# Patient Record
Sex: Male | Born: 1985 | Hispanic: Yes | Marital: Married | Smoking: Never smoker
Health system: Southern US, Community
[De-identification: ages and names within clinical notes are randomized; demographics above are authoritative.]

---

## 2015-10-10 ENCOUNTER — Other Ambulatory Visit: Payer: Self-pay | Admitting: Family Medicine

## 2015-10-10 DIAGNOSIS — N50819 Testicular pain, unspecified: Secondary | ICD-10-CM

## 2015-10-10 DIAGNOSIS — R109 Unspecified abdominal pain: Secondary | ICD-10-CM

## 2015-10-14 ENCOUNTER — Ambulatory Visit
Admission: RE | Admit: 2015-10-14 | Discharge: 2015-10-14 | Disposition: A | Discharge status: Home / Self Care | Payer: BLUE CROSS/BLUE SHIELD | Source: Ambulatory Visit | Attending: Family Medicine | Admitting: Family Medicine

## 2015-10-14 ENCOUNTER — Other Ambulatory Visit: Payer: Self-pay | Admitting: Family Medicine

## 2015-10-14 DIAGNOSIS — R109 Unspecified abdominal pain: Secondary | ICD-10-CM

## 2015-10-14 DIAGNOSIS — N50819 Testicular pain, unspecified: Secondary | ICD-10-CM

## 2017-07-19 IMAGING — US US PELVIS LIMITED
1 series · 8 of 8 positions shown · non-contrast
Comparison: Scrotal ultrasound same day

CLINICAL DATA: Possible left inguinal hernia

EXAM:
LIMITED ULTRASOUND OF left inguinal region
TECHNIQUE: Limited transabdominal ultrasound examination of the left inguinal
region

[Series 1: us pelvis limited · 0.12mm/px · 8 acquisitions, 8 frames shown]
[im 1/8]
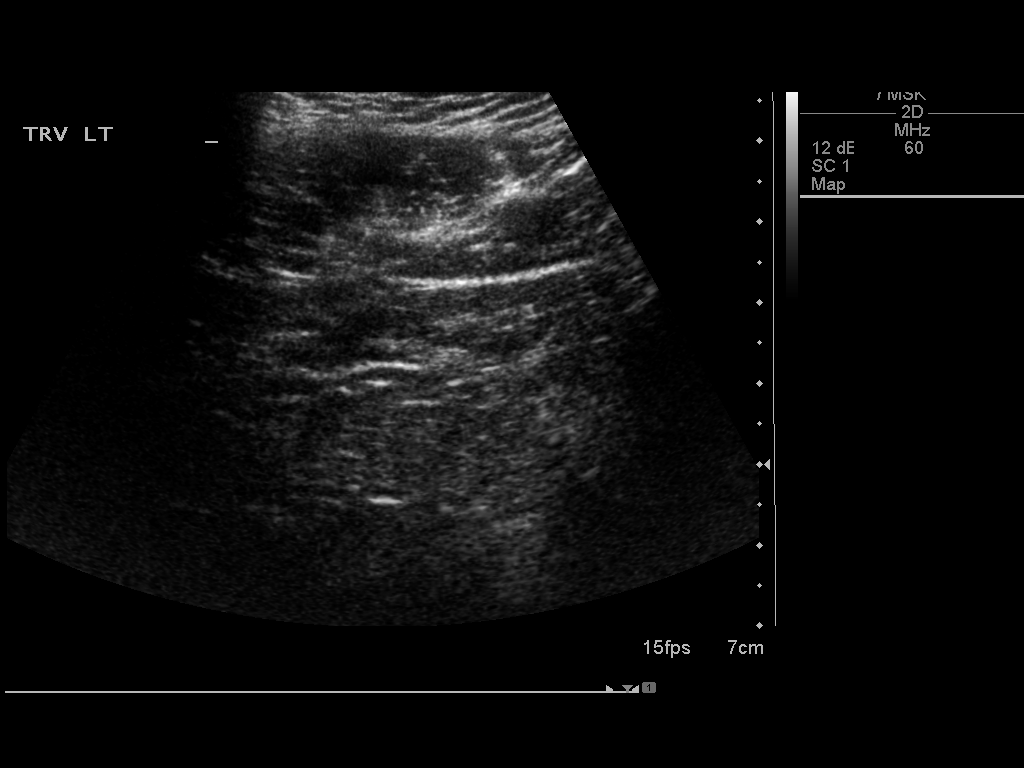
[im 2/8]
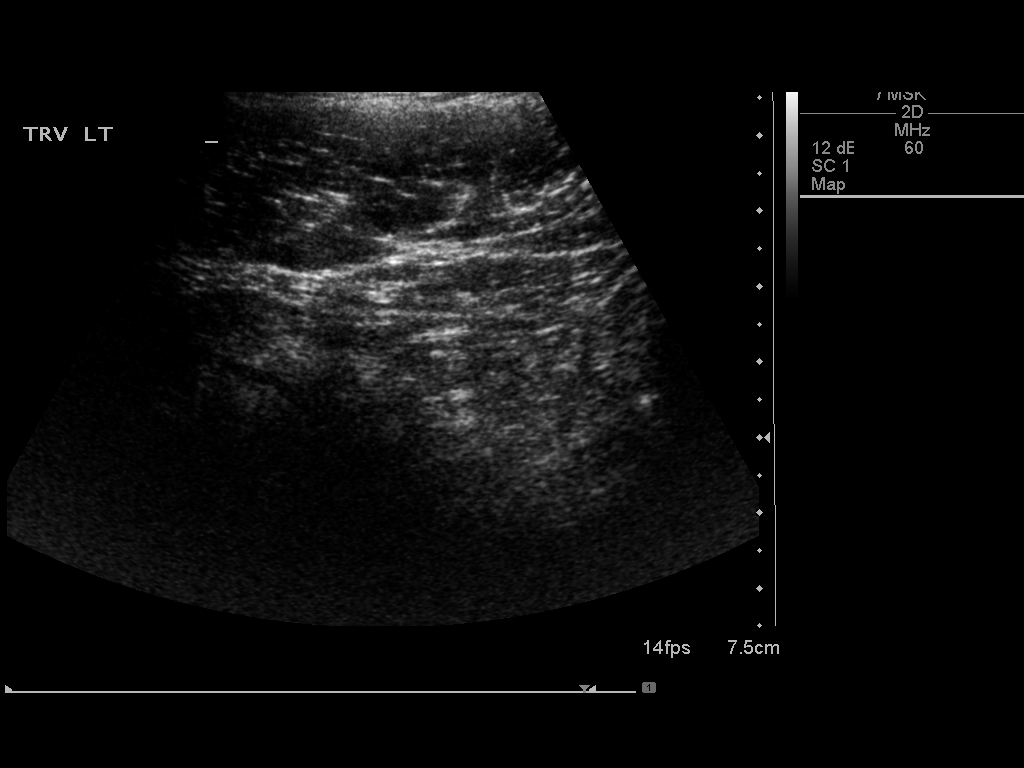
[im 3/8]
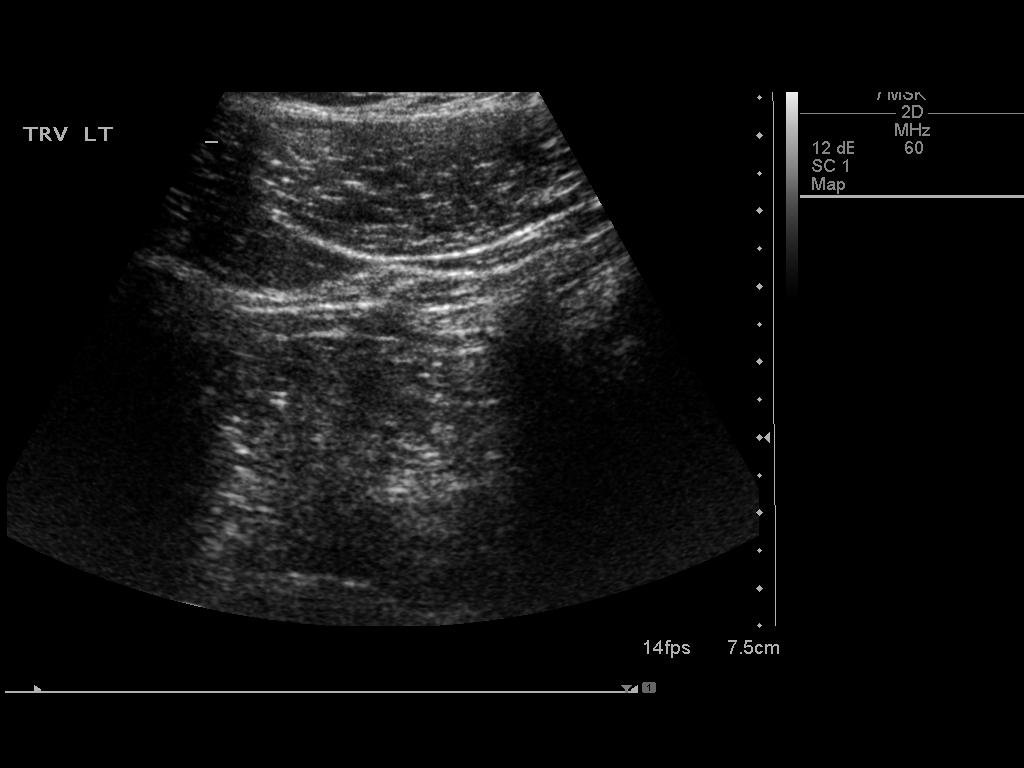
[im 4/8]
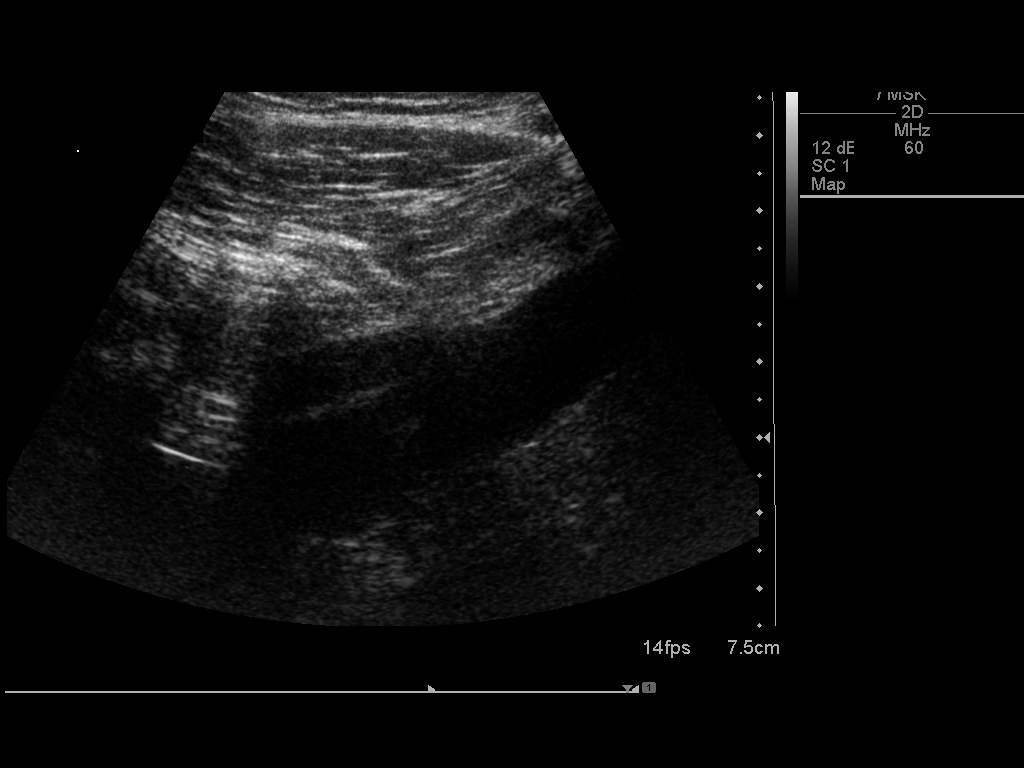
[im 5/8]
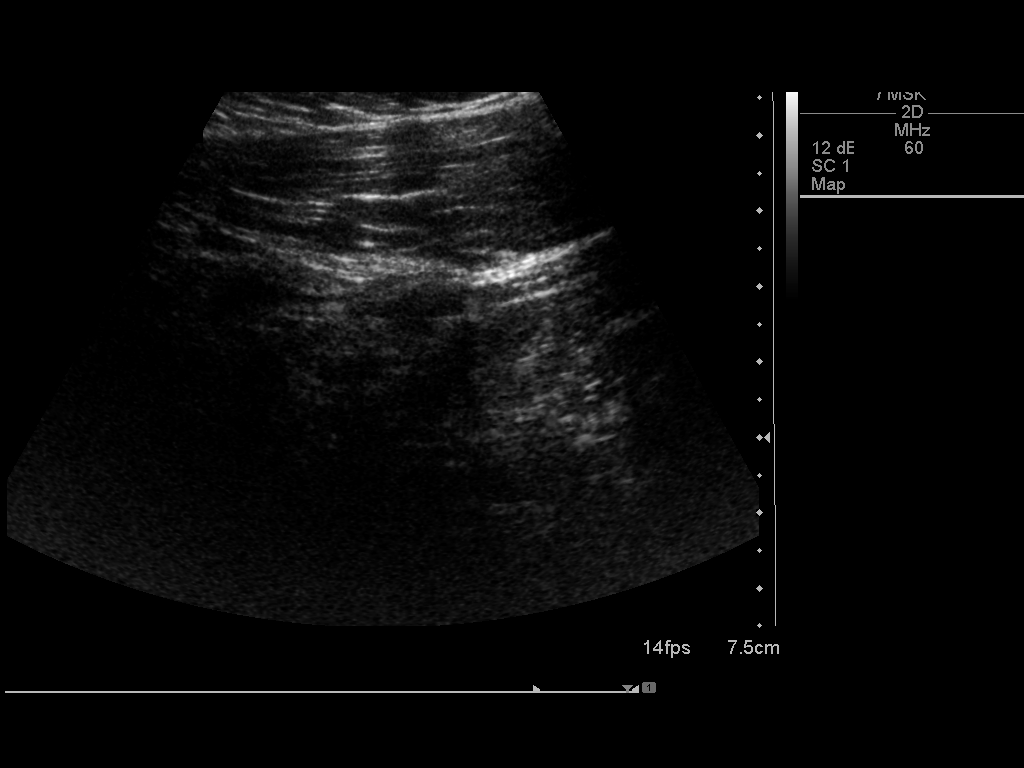
[im 6/8]
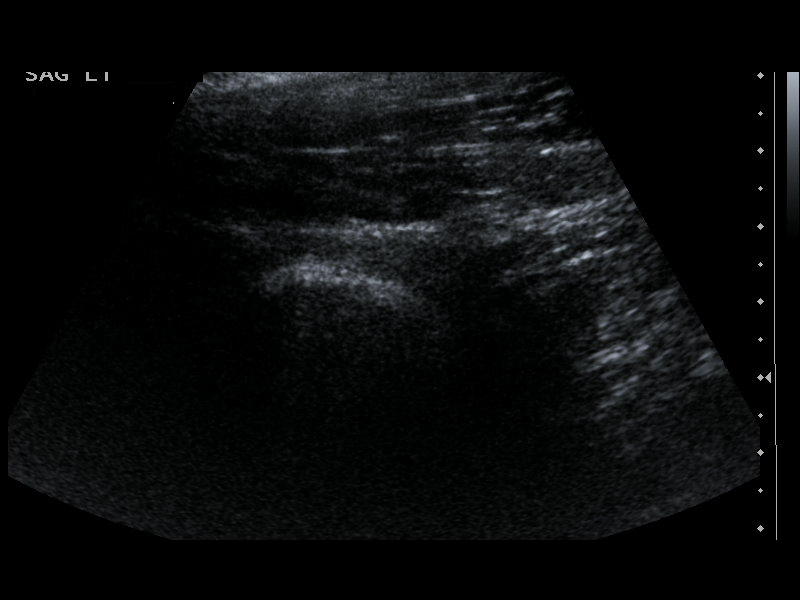
[im 7/8]
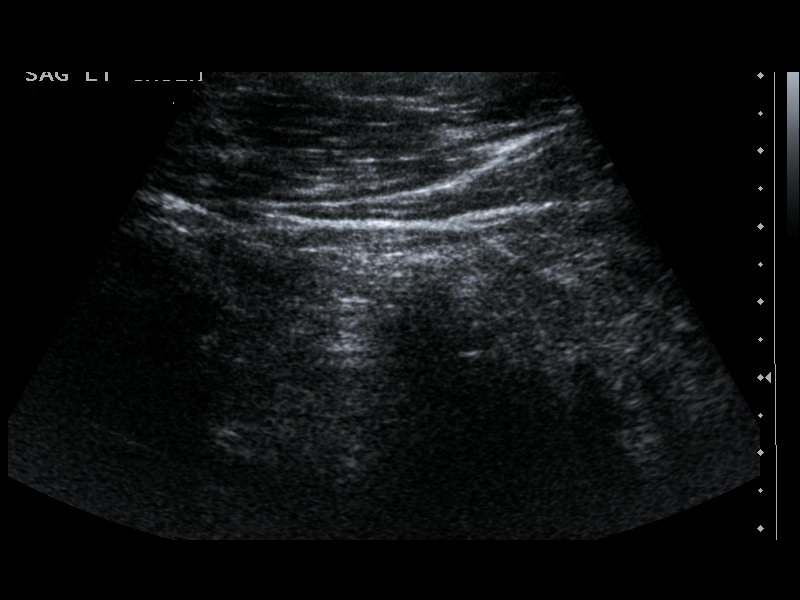
[im 8/8]
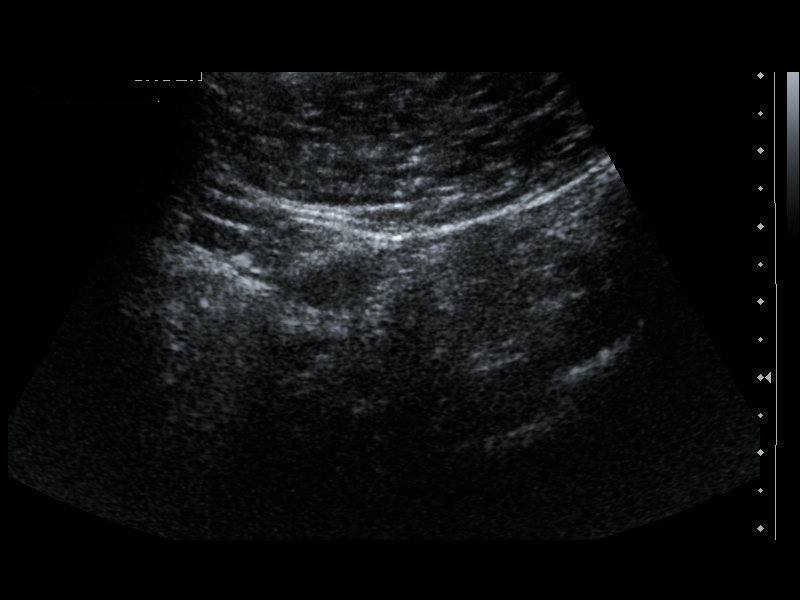

[8 of 8 positions shown; findings below may reference images not displayed]

FINDINGS: Limited ultrasound of the left inguinal region demonstrates no
evidence of mass or fluid collection. There is no evidence of
hernia.
IMPRESSION: No evidence of mass or fluid collection.  No evidence of hernia.

## 2018-02-13 ENCOUNTER — Encounter (HOSPITAL_BASED_OUTPATIENT_CLINIC_OR_DEPARTMENT_OTHER): Payer: Self-pay | Admitting: Emergency Medicine

## 2018-02-13 ENCOUNTER — Emergency Department (HOSPITAL_BASED_OUTPATIENT_CLINIC_OR_DEPARTMENT_OTHER)
Admission: EM | Admit: 2018-02-13 | Discharge: 2018-02-13 | Disposition: A | Discharge status: Home / Self Care | Payer: BLUE CROSS/BLUE SHIELD | Attending: Emergency Medicine | Admitting: Emergency Medicine

## 2018-02-13 ENCOUNTER — Other Ambulatory Visit: Payer: Self-pay

## 2018-02-13 DIAGNOSIS — F458 Other somatoform disorders: Secondary | ICD-10-CM | POA: Diagnosis not present

## 2018-02-13 DIAGNOSIS — Z79899 Other long term (current) drug therapy: Secondary | ICD-10-CM | POA: Insufficient documentation

## 2018-02-13 DIAGNOSIS — R0989 Other specified symptoms and signs involving the circulatory and respiratory systems: Secondary | ICD-10-CM | POA: Diagnosis present

## 2018-02-13 MED ORDER — LIDOCAINE VISCOUS HCL 2 % MT SOLN
15.0000 mL | Freq: Once | OROMUCOSAL | Status: AC
  Administered 2018-02-13: 15 mL via OROMUCOSAL
  Filled 2018-02-13: qty 15

## 2018-02-13 NOTE — Discharge Instructions (Addendum)
Drink a full glass of water when you take a pill. Return to the ED for new or worsening symptoms, such as vomiting, unable to tolerate secretions, fevers, increased pain, or any concerns at all.

## 2018-02-13 NOTE — ED Triage Notes (Signed)
Pt was taking his prescribed medication valACyclovir HCL and 2 pills got stuck on his throat, pt is able to talk on full sentences no SOB.  This happened more than 4 hours ago.

## 2018-02-13 NOTE — ED Provider Notes (Signed)
[facility] EMERGENCY DEPARTMENT Provider Note   CSN: [id number] Arrival date & time: 02/13/18  2026     History   Chief Complaint Chief Complaint  Patient presents with  . pills stuck on his throat    HPI Ronald Bonilla is a 32 y.o. male. Pt states he swallowed two valacyclovir pills approx 4 hours ago and since then has felt like something is stuck in his throat. Pt states he tried drinking water and eating a plantain. He states he was able to drink the water and eat the plantain without difficulty but still felt like something was stuck so he made himself throw up the food. Pt has been tolerating secretions without difficulty. Pt denies any SOB, coughing, CP, nausea.   The history is provided by the patient.  Swallowed Foreign Body  The current episode started 3 to 5 hours ago. The problem has not changed since onset.Pertinent negatives include no chest pain, no abdominal pain and no shortness of breath. Nothing aggravates the symptoms. Nothing relieves the symptoms.    History reviewed. No pertinent past medical history.  There are no active problems to display for this patient.   History reviewed. No pertinent surgical history.      Home Medications    Prior to Admission medications   Medication Sig Start Date End Date Taking? Authorizing Provider  valACYclovir (VALTREX) 1000 MG tablet Take 1,000 mg by mouth 2 (two) times daily.   Yes [provider]    Family History History reviewed. No pertinent family history.  Social History Social History   Tobacco Use  . Smoking status: Never Smoker  . Smokeless tobacco: Never Used  Substance Use Topics  . Alcohol use: Never    Frequency: Never  . Drug use: Never     Allergies   Patient has no known allergies.   Review of Systems Review of Systems  Constitutional: Negative for chills and fever.  HENT: Negative for rhinorrhea and sore throat.   Eyes: Negative for visual disturbance.   Respiratory: Negative for cough and shortness of breath.   Cardiovascular: Negative for chest pain and leg swelling.  Gastrointestinal: Positive for vomiting (forced himself to vomit). Negative for abdominal pain, diarrhea and nausea.       Fells like something is stuck in his throat  Genitourinary: Negative for dysuria, frequency and urgency.  Musculoskeletal: Negative for joint swelling and neck pain.  Skin: Negative for rash and wound.  Neurological: Negative for syncope and numbness.  All other systems reviewed and are negative.    Physical Exam Updated Vital Signs BP 122/78 (BP Location: Right Arm)   Pulse 76   Temp 97.7 F (36.5 C) (Oral)   Resp 18   Ht 5\' 8"  (1.727 m)   Wt 68 kg   SpO2 100%   BMI 22.81 kg/m   Physical Exam  Constitutional: He is oriented to person, place, and time. He appears well-developed and well-nourished.  HENT:  Head: Normocephalic and atraumatic.  Mouth/Throat: Oropharynx is clear and moist.  Eyes: Conjunctivae and EOM are normal.  Neck: Neck supple. No tracheal deviation present.  TTP over right side of the anterior neck, just behind the trachea. No crepitus.  Cardiovascular: Normal rate, regular rhythm and normal heart sounds.  No murmur heard. Pulmonary/Chest: Effort normal and breath sounds normal. No respiratory distress. He has no wheezes. He has no rales.  Abdominal: Soft. Bowel sounds are normal. He exhibits no distension. There is no tenderness.  Musculoskeletal: Normal  range of motion. He exhibits no tenderness or deformity.  Lymphadenopathy:    He has no cervical adenopathy.  Neurological: He is alert and oriented to person, place, and time.  Skin: Skin is warm and dry. No rash noted. No erythema.  Psychiatric: He has a normal mood and affect. His behavior is normal.  Nursing note and vitals reviewed.    ED Treatments / Results  Labs (all labs ordered are listed, but only abnormal results are displayed) Labs Reviewed - No  data to display  EKG None  Radiology No results found.  Procedures Procedures (including critical care time)  Medications Ordered in ED Medications  lidocaine (XYLOCAINE) 2 % viscous mouth solution 15 mL (has no administration in time range)     Initial Impression / Assessment and Plan / ED Course  I have reviewed the triage vital signs and the nursing notes.  Pertinent labs & imaging results that were available during my care of the patient were reviewed by me and considered in my medical decision making (see chart for details).     Pt states he has no change after viscous lidocaine. Discussed with Dr. Adela Lank who recommends education on taking pills with a full glass of water. Dr. Adela Lank does not recommend any further treatment at this time. Pt continues to tolerate secretions. No evidence of airway involvement. Discussed in depth risks, benefits and alternatives to X-ray at this time. After discussing, pt declined X-ray. No crepitus on exam, physical not consistent with perforation. Pt will follow-up with a GI doctor. Given strict return precautions and expressed understanding.   At this time there does not appear to be any evidence of an acute emergency medical condition and the patient appears stable for discharge with appropriate outpatient follow up.Diagnosis was discussed with patient who verbalizes understanding and is agreeable to discharge.   Final Clinical Impressions(s) / ED Diagnoses   Final diagnoses:  None    ED Discharge Orders    None       Clayborne Artist, PA-C 02/13/18 2353    Melene Plan, DO 02/14/18 [phone]

## 2019-01-22 ENCOUNTER — Ambulatory Visit: Payer: BLUE CROSS/BLUE SHIELD | Admitting: Gastroenterology
